# Patient Record
Sex: Female | Born: 2000 | Race: Black or African American | Hispanic: No | Marital: Single | Smoking: Never smoker
Health system: Southern US, Community
[De-identification: ages and names within clinical notes are randomized; demographics above are authoritative.]

---
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
## 2018-09-28 ENCOUNTER — Ambulatory Visit (HOSPITAL_COMMUNITY)
Admission: EM | Admit: 2018-09-28 | Discharge: 2018-09-28 | Disposition: A | Discharge status: Home / Self Care | Payer: 59 | Attending: Family Medicine | Admitting: Family Medicine

## 2018-09-28 ENCOUNTER — Encounter (HOSPITAL_COMMUNITY): Payer: Self-pay | Admitting: Emergency Medicine

## 2018-09-28 DIAGNOSIS — M778 Other enthesopathies, not elsewhere classified: Secondary | ICD-10-CM | POA: Diagnosis not present

## 2018-09-28 MED ORDER — IBUPROFEN 600 MG PO TABS: 600.0000 mg | ORAL_TABLET | Freq: Four times a day (QID) | ORAL | 0 refills | Status: DC | PRN

## 2018-09-28 NOTE — ED Triage Notes (Signed)
Pt sts right arm pain x 2 days but denies injury

## 2018-09-28 NOTE — Discharge Instructions (Signed)
Use anti-inflammatories for pain/swelling. You may take up to 600-800 mg Ibuprofen every 8 hours with food. You may supplement Ibuprofen with Tylenol [phone] mg every 8 hours.   Ice elbow Rest elbow  Wear wrist splint to avoid motions that aggravate the pain at the elbow.   Follow up if pain not improving or worsening, unable to move elbow

## 2018-09-29 NOTE — ED Provider Notes (Addendum)
[facility]    CSN: [id number] Arrival date & time: 09/28/18  1519     History   Chief Complaint Chief Complaint  Patient presents with  . Arm Pain    HPI Sandy Rollins is a 17 y.o. female no significant past medical history presenting today for evaluation of right elbow pain.  Patient states that for the past 2 days she has had worsening pain especially with movement of her elbow.  He is having difficulty doing things above her head due to the pain.  She denies any injury.  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  Patient is right-handed.  Denies previous issues with her elbow.  She has tried Tylenol extra strength as well as IcyHot.  Denies pain at the shoulder, wrist/hand.  Denies numbness or tingling.  HPI  History reviewed. No pertinent past medical history.  There are no active problems to display for this patient.   History reviewed. No pertinent surgical history.  OB History   None      Home Medications    Prior to Admission medications   Medication Sig Start Date End Date Taking? Authorizing Provider  ibuprofen (ADVIL,MOTRIN) 600 MG tablet Take 1 tablet (600 mg total) by mouth every 6 (six) hours as needed. 09/28/18   Wieters, Junius Creamer, PA-C    Family History Family History  Family history unknown: Yes    Social History Social History   Tobacco Use  . Smoking status: Never Smoker  . Smokeless tobacco: Never Used  Substance Use Topics  . Alcohol use: No  . Drug use: No     Allergies   Patient has no known allergies.   Review of Systems Review of Systems  Constitutional: Negative for fatigue and fever.  Eyes: Negative for visual disturbance.  Respiratory: Negative for shortness of breath.   Cardiovascular: Negative for chest pain.  Gastrointestinal: Negative for abdominal pain, nausea and vomiting.  Musculoskeletal: Positive for arthralgias, joint swelling and myalgias.  Skin:  Positive for color change and rash. Negative for wound.  Neurological: Negative for dizziness, weakness, light-headedness and headaches.     Physical Exam Triage Vital Signs ED Triage Vitals [09/28/18 1610]  Enc Vitals Group     BP 113/63     Pulse Rate 66     Resp 18     Temp 97.8 F (36.6 C)     Temp Source Oral     SpO2 100 %     Weight      Height      Head Circumference      Peak Flow      Pain Score 8     Pain Loc      Pain Edu?      Excl. in GC?    No data found.  Updated Vital Signs BP 113/63 (BP Location: Left Arm)   Pulse 66   Temp 97.8 F (36.6 C) (Oral)   Resp 18   SpO2 100%   Visual Acuity Right Eye Distance:   Left Eye Distance:   Bilateral Distance:    Right Eye Near:   Left Eye Near:    Bilateral Near:     Physical Exam  Constitutional: She is oriented to person, place, and time. She appears well-developed and well-nourished.  No acute distress  HENT:  Head: Normocephalic and atraumatic.  Nose: Nose normal.  Eyes: Conjunctivae are normal.  Neck: Neck supple.  Cardiovascular:  Normal rate.  Pulmonary/Chest: Effort normal. No respiratory distress.  Abdominal: She exhibits no distension.  Musculoskeletal: Normal range of motion.  Patient holding right arm flexed at 90 degrees Tender to palpation overlying medial and lateral areas of elbow, tender to palpation throughout the forearm and distal ulna and radius.  Full active range of motion of fingers and wrist.  Patient is able to extend and flex arm although patient resisting significantly due to pain.  Full active range of motion at shoulder.  Mild swelling and minimal increase in warmth, no overlying erythema to elbow  Neurological: She is alert and oriented to person, place, and time.  Skin: Skin is warm and dry.  Psychiatric: She has a normal mood and affect.  Nursing note and vitals reviewed.    UC Treatments / Results  Labs (all labs ordered are listed, but only abnormal results  are displayed) Labs Reviewed - No data to display  EKG None  Radiology No results found.  Procedures Procedures (including critical care time)  Medications Ordered in UC Medications - No data to display  Initial Impression / Assessment and Plan / UC Course  I have reviewed the triage vital signs and the nursing notes.  Pertinent labs & imaging results that were available during my care of the patient were reviewed by me and considered in my medical decision making (see chart for details).     Patient likely with overuse injury at elbow, tendinitis versus elbow/golfer's elbow.  Patient very resistant to move elbow, but does not have significant erythema, swelling concerning for septic joint.  At this time will treat for tendinitis, given lack of injury will defer imaging.  Will provide a long forearm wrist splint to limit the motions that would aggravate tendinitis, ice, rest and anti-inflammatories consistently.Discussed strict return precautions. Patient verbalized understanding and is agreeable with plan.  Final Clinical Impressions(s) / UC Diagnoses   Final diagnoses:  Elbow tendonitis     Discharge Instructions     Use anti-inflammatories for pain/swelling. You may take up to 600-800 mg Ibuprofen every 8 hours with food. You may supplement Ibuprofen with Tylenol [phone] mg every 8 hours.   Ice elbow Rest elbow  Wear wrist splint to avoid motions that aggravate the pain at the elbow.   Follow up if pain not improving or worsening, unable to move elbow   ED Prescriptions    Medication Sig Dispense Auth. Provider   ibuprofen (ADVIL,MOTRIN) 600 MG tablet Take 1 tablet (600 mg total) by mouth every 6 (six) hours as needed. 30 tablet Wieters, [place] C, PA-C     Controlled Substance Prescriptions [place] Controlled Substance Registry consulted? Not Applicable   Lew Dawes, PA-C 09/29/18 1914    Lew Dawes, PA-C 09/29/18 [phone]

## 2018-12-24 ENCOUNTER — Encounter (HOSPITAL_COMMUNITY): Payer: Self-pay | Admitting: Emergency Medicine

## 2018-12-24 ENCOUNTER — Ambulatory Visit (HOSPITAL_COMMUNITY)
Admission: EM | Admit: 2018-12-24 | Discharge: 2018-12-24 | Disposition: A | Discharge status: Home / Self Care | Payer: 59 | Attending: Family Medicine | Admitting: Family Medicine

## 2018-12-24 DIAGNOSIS — R69 Illness, unspecified: Secondary | ICD-10-CM | POA: Diagnosis present

## 2018-12-24 DIAGNOSIS — J111 Influenza due to unidentified influenza virus with other respiratory manifestations: Secondary | ICD-10-CM

## 2018-12-24 LAB — POCT RAPID STREP A: Streptococcus, Group A Screen (Direct): NEGATIVE

## 2018-12-24 MED ORDER — CETIRIZINE HCL 10 MG PO CAPS: 10.0000 mg | ORAL_CAPSULE | Freq: Every day | ORAL | 0 refills | Status: DC

## 2018-12-24 MED ORDER — IBUPROFEN 600 MG PO TABS: 600.0000 mg | ORAL_TABLET | Freq: Four times a day (QID) | ORAL | 0 refills | Status: DC | PRN

## 2018-12-24 NOTE — Discharge Instructions (Signed)
Your rapid strep tested Negative today. We will send for a culture and call in about 2 days if results are positive. For now we will treat your sore throat as a virus with symptom management. Symptoms are concerning for flu as well, this is also a virus the runs its course over 1 week and requires treatment of symptoms.  Use anti-inflammatories for pain/swelling. You may take up to 800 mg Ibuprofen every 8 hours with food. You may supplement Ibuprofen with Tylenol [phone] mg every 8 hours.    Please continue Tylenol or Ibuprofen for fever and pain. May try salt water gargles, cepacol lozenges, throat spray, or OTC cold relief medicine for throat discomfort. If you also have congestion take a daily anti-histamine like Zyrtec, Claritin, and a oral decongestant to help with post nasal drip that may be irritating your throat.   Stay hydrated and drink plenty of fluids to keep your throat coated relieve irritation.

## 2018-12-24 NOTE — ED Provider Notes (Signed)
[facility]    CSN: [id number] Arrival date & time: 12/24/18  [id number]     History   Chief Complaint Chief Complaint  Patient presents with  . Fever  . Generalized Body Aches    HPI Sandy Rollins is a 18 y.o. female no significant past medical history presenting today for evaluation of fever and body aches.  Patient states that last night she began to have soreness in her chest.  When she woke up this morning she felt okay, but when she went to work her symptoms worsen.  She noted to have a fever at work of 101.  She states that she did not take anything for her temperature.  She is also had associated headache, pressure on the eyes as well as a sore throat.  Denies vision changes, denies eye drainage.  She denies any nasal congestion or cough.  Denies shortness of breath.  Denies nausea, vomiting or diarrhea.  Patient also has had decreased appetite and states that she has lost her taste.  HPI  History reviewed. No pertinent past medical history.  There are no active problems to display for this patient.   History reviewed. No pertinent surgical history.  OB History   No obstetric history on file.      Home Medications    Prior to Admission medications   Medication Sig Start Date End Date Taking? Authorizing Provider  Cetirizine HCl 10 MG CAPS Take 1 capsule (10 mg total) by mouth daily for 10 days. 12/24/18 01/03/19  Keylen Eckenrode C, PA-C  ibuprofen (ADVIL,MOTRIN) 600 MG tablet Take 1 tablet (600 mg total) by mouth every 6 (six) hours as needed. 12/24/18   Aasia Peavler, Junius CreamerHallie C, PA-C    Family History Family History  Family history unknown: Yes    Social History Social History   Tobacco Use  . Smoking status: Never Smoker  . Smokeless tobacco: Never Used  Substance Use Topics  . Alcohol use: No  . Drug use: No     Allergies   Patient has no known allergies.   Review of Systems Review of Systems  Constitutional: Positive for appetite change,  chills, fatigue and fever. Negative for activity change.  HENT: Positive for sore throat. Negative for congestion, ear pain, rhinorrhea, sinus pressure and trouble swallowing.   Eyes: Negative for discharge, redness and visual disturbance.  Respiratory: Negative for cough, chest tightness and shortness of breath.   Cardiovascular: Negative for chest pain.  Gastrointestinal: Negative for abdominal pain, diarrhea, nausea and vomiting.  Musculoskeletal: Positive for myalgias.  Skin: Negative for rash.  Neurological: Positive for headaches. Negative for dizziness and light-headedness.     Physical Exam Triage Vital Signs ED Triage Vitals  Enc Vitals Group     BP 12/24/18 1734 116/68     Pulse Rate 12/24/18 1734 (!) 112     Resp 12/24/18 1734 17     Temp 12/24/18 1734 98.3 F (36.8 C)     Temp Source 12/24/18 1734 Temporal     SpO2 12/24/18 1734 100 %     Weight 12/24/18 1735 115 lb (52.2 kg)     Height 12/24/18 1735 5\' 3"  (1.6 m)     Head Circumference --      Peak Flow --      Pain Score 12/24/18 1735 9     Pain Loc --      Pain Edu? --      Excl. in GC? --    No data  found.  Updated Vital Signs BP 116/68 (BP Location: Left Arm)   Pulse (!) 112   Temp 98.3 F (36.8 C) (Temporal)   Resp 17   Ht 5\' 3"  (1.6 m)   Wt 115 lb (52.2 kg)   LMP 11/25/2018   SpO2 100%   BMI 20.37 kg/m   Visual Acuity Right Eye Distance:   Left Eye Distance:   Bilateral Distance:    Right Eye Near:   Left Eye Near:    Bilateral Near:     Physical Exam Vitals signs and nursing note reviewed.  Constitutional:      General: She is not in acute distress.    Appearance: She is well-developed.     Comments: Patient lying on exam table, appears uncomfortable and tired, but nontoxic appearing, no acute distress  HENT:     Head: Normocephalic and atraumatic.     Ears:     Comments: Bilateral ears without tenderness to palpation of external auricle, tragus and mastoid, EAC's without erythema  or swelling, TM's with good bony landmarks and cone of light. Non erythematous.    Nose:     Comments: Nasal mucosa erythematous, swollen turbinates bilaterally with rhinorrhea present    Mouth/Throat:     Comments: Oral mucosa pink and moist, no tonsillar enlargement or exudate, but the tonsils do appear slightly erythematous . Posterior pharynx patent and erythematous, no uvula deviation or swelling. Normal phonation. Eyes:     Conjunctiva/sclera: Conjunctivae normal.  Neck:     Musculoskeletal: Neck supple.  Cardiovascular:     Rate and Rhythm: Regular rhythm. Tachycardia present.     Heart sounds: No murmur.     Comments: Heart rate rechecked, 110 Pulmonary:     Effort: Pulmonary effort is normal. No respiratory distress.     Breath sounds: Normal breath sounds.     Comments: Breathing comfortably at rest, CTABL, no wheezing, rales or other adventitious sounds auscultated Abdominal:     Palpations: Abdomen is soft.     Tenderness: There is no abdominal tenderness.  Skin:    General: Skin is warm and dry.  Neurological:     Mental Status: She is alert.      UC Treatments / Results  Labs (all labs ordered are listed, but only abnormal results are displayed) Labs Reviewed  CULTURE, GROUP A STREP [facility])  POCT RAPID STREP A    EKG None  Radiology No results found.  Procedures Procedures (including critical care time)  Medications Ordered in UC Medications - No data to display  Initial Impression / Assessment and Plan / UC Course  I have reviewed the triage vital signs and the nursing notes.  Pertinent labs & imaging results that were available during my care of the patient were reviewed by me and considered in my medical decision making (see chart for details).    18 year old female, no significant past medical history, 1 day of subjective fever, body aches and other non-focal symptoms.  Exam nonfocal.  Patient is tachycardic however.  Strep test negative.  Will  treat for likely viral illness, possible influenza.  Will recommend symptomatic and supportive care.  Close monitoring.  Discussed symptomatic management of sore throat, recommendations below.  Discussed importance of oral hydration despite decreased appetite.Discussed strict return precautions. Patient verbalized understanding and is agreeable with plan.  Final Clinical Impressions(s) / UC Diagnoses   Final diagnoses:  Influenza-like illness     Discharge Instructions     Your rapid strep tested Negative  today. We will send for a culture and call in about 2 days if results are positive. For now we will treat your sore throat as a virus with symptom management. Symptoms are concerning for flu as well, this is also a virus the runs its course over 1 week and requires treatment of symptoms.  Use anti-inflammatories for pain/swelling. You may take up to 800 mg Ibuprofen every 8 hours with food. You may supplement Ibuprofen with Tylenol [phone] mg every 8 hours.    Please continue Tylenol or Ibuprofen for fever and pain. May try salt water gargles, cepacol lozenges, throat spray, or OTC cold relief medicine for throat discomfort. If you also have congestion take a daily anti-histamine like Zyrtec, Claritin, and a oral decongestant to help with post nasal drip that may be irritating your throat.   Stay hydrated and drink plenty of fluids to keep your throat coated relieve irritation.    ED Prescriptions    Medication Sig Dispense Auth. Provider   ibuprofen (ADVIL,MOTRIN) 600 MG tablet Take 1 tablet (600 mg total) by mouth every 6 (six) hours as needed. 30 tablet Kharter Brew C, PA-C   Cetirizine HCl 10 MG CAPS Take 1 capsule (10 mg total) by mouth daily for 10 days. 10 capsule Abdulrahim Siddiqi C, PA-C     Controlled Substance Prescriptions [place] Controlled Substance Registry consulted? Not Applicable   Lew DawesWieters, Evanthia Maund C, [place]PA-C 12/24/18 1908

## 2018-12-24 NOTE — ED Triage Notes (Signed)
Pt. Stated, I got a fever and my body is sore.

## 2018-12-27 LAB — CULTURE, GROUP A STREP (THRC)

## 2019-06-28 ENCOUNTER — Emergency Department (HOSPITAL_COMMUNITY)
Admission: EM | Admit: 2019-06-28 | Discharge: 2019-06-28 | Disposition: A | Discharge status: Home / Self Care | Payer: 59 | Attending: Emergency Medicine | Admitting: Emergency Medicine

## 2019-06-28 ENCOUNTER — Other Ambulatory Visit: Payer: Self-pay

## 2019-06-28 DIAGNOSIS — M545 Low back pain, unspecified: Secondary | ICD-10-CM

## 2019-06-28 DIAGNOSIS — N3000 Acute cystitis without hematuria: Secondary | ICD-10-CM | POA: Diagnosis not present

## 2019-06-28 LAB — URINALYSIS, ROUTINE W REFLEX MICROSCOPIC
Bilirubin Urine: NEGATIVE
Glucose, UA: NEGATIVE mg/dL
Hgb urine dipstick: NEGATIVE
Ketones, ur: NEGATIVE mg/dL
Nitrite: NEGATIVE
Protein, ur: NEGATIVE mg/dL
Specific Gravity, Urine: 1.023 (ref 1.005–1.030)
pH: 6 (ref 5.0–8.0)

## 2019-06-28 LAB — POC URINE PREG, ED: Preg Test, Ur: NEGATIVE

## 2019-06-28 MED ORDER — ACETAMINOPHEN 325 MG PO TABS
650.0000 mg | ORAL_TABLET | Freq: Once | ORAL | Status: AC
  Administered 2019-06-28: 650 mg via ORAL
  Filled 2019-06-28: qty 2

## 2019-06-28 MED ORDER — SULFAMETHOXAZOLE-TRIMETHOPRIM 800-160 MG PO TABS: 1.0000 | ORAL_TABLET | Freq: Two times a day (BID) | ORAL | 0 refills | Status: AC

## 2019-06-28 NOTE — Discharge Instructions (Addendum)
You have been seen today for back pain. Please read and follow all provided instructions.   1. Medications: tylenol/ibuprofen for back pain, Bactrim (antibiotic), usual home medications 2. Treatment: rest, drink plenty of fluids 3. Follow Up: Please follow up with your primary doctor in 2-5 days for discussion of your diagnoses and further evaluation after today's visit; if you do not have a primary care doctor use the resource guide provided to find one; Please return to the ER for any new or worsening symptoms. Please obtain all of your results from medical records or have your doctors office obtain the results - share them with your doctor - you should be seen at your doctors office. Call today to arrange your follow up.   Take medications as prescribed. Please review all of the medicines and only take them if you do not have an allergy to them. Return to the emergency room for worsening condition or new concerning symptoms. Follow up with your regular doctor. If you don't have a regular doctor use one of the numbers below to establish a primary care doctor.  Please be aware that if you are taking birth control pills, taking other prescriptions, ESPECIALLY ANTIBIOTICS may make the birth control ineffective - if this is the case, either do not engage in sexual activity or use alternative methods of birth control such as condoms until you have finished the medicine and your family doctor says it is OK to restart them. If you are on a blood thinner such as COUMADIN, be aware that any other medicine that you take may cause the coumadin to either work too much, or not enough - you should have your coumadin level rechecked in next 7 days if this is the case.  ?  It is also a possibility that you have an allergic reaction to any of the medicines that you have been prescribed - Everybody reacts differently to medications and while MOST people have no trouble with most medicines, you may have a reaction such as  nausea, vomiting, rash, swelling, shortness of breath. If this is the case, please stop taking the medicine immediately and contact your physician.  ?  You should return to the ER if you develop severe or worsening symptoms.   Emergency Department Resource Guide 1) Find a Doctor and Pay Out of Pocket Although you won't have to find out who is covered by your insurance plan, it is a good idea to ask around and get recommendations. You will then need to call the office and see if the doctor you have chosen will accept you as a new patient and what types of options they offer for patients who are self-pay. Some doctors offer discounts or will set up payment plans for their patients who do not have insurance, but you will need to ask so you aren't surprised when you get to your appointment.  2) Contact Your Local Health Department Not all health departments have doctors that can see patients for sick visits, but many do, so it is worth a call to see if yours does. If you don't know where your local health department is, you can check in your phone book. The CDC also has a tool to help you locate your state's health department, and many state websites also have listings of all of their local health departments.  3) Find a [facility] If your illness is not likely to be very severe or complicated, you may want to try a walk in clinic.  These are popping up all over the country in pharmacies, drugstores, and shopping centers. They're usually staffed by nurse practitioners or physician assistants that have been trained to treat common illnesses and complaints. They're usually fairly quick and inexpensive. However, if you have serious medical issues or chronic medical problems, these are probably not your best option.  No Primary Care Doctor: Call [facility] Connect at  [phone] - they can help you locate a primary care doctor that  accepts your insurance, provides certain services, etc. Physician Referral  Service- [phone]  Chronic Pain Problems: Organization         Address  Phone   Notes  [facility]  [phone] Patients need to be referred by their primary care doctor.   Medication Assistance: Organization         Address  Phone   Notes  [facility] Medication [facility] [address] [place], [place] [address] [phone] --Must be a resident of [facility] -- Must have NO insurance coverage whatsoever (no Medicaid/ Medicare, etc.) -- The pt. MUST have a primary care doctor that directs their care regularly and follows them in the community   [facility]  [phone]   [employer]  [phone]    Agencies that provide inexpensive medical care: Organization         Address  Phone   Notes  [place]  [phone]   [facility]    [phone]   [facility] [place],  [address] [phone]   [place] [address] [place] [phone]   [facility]    [phone]   [facility]    [phone]   [place] and [address], [place] Phone:  [phone], Fax:  [phone] Hours of Operation:  9 am - 6 pm, M-F.  Also accepts Medicaid/Medicare and self-pay.  [facility] [address], [address], [place] Phone: [phone], Fax: [phone]. Hours of Operation:  8:30 am - 5:30 pm, M-F.  Also accepts Medicaid and self-pay.  [facility] [address] [place] Phone: [phone]   [place], [place], [place] [phone], [phone] Mondays & Thursdays: 7-9 AM.  First 15 patients are seen on a first come, first serve basis.    [place] Providers:  Organization         Address  Phone   Notes  [facility] [address],  [phone] Also accepts self-pay patients.  [facility] [address], [place]  [phone]   [place], [address], [place] [phone]   [facility] [address], [place] [phone]   Lucianne Lei [address], [place]   [phone] Only accepts [place] [facility] [place] patients after they have their name applied to their card.   Self-Pay (no insurance) in [facility]:  Organization         Address  Phone   Notes  Sickle Cell Patients, [facility] [place] [phone]   [facility] [place] [phone]   [facility] [address]  [place], [address], [place] [phone]   [facility]/Dr. Osei-Bonsu  [address] [place] or [address], [place] [phone] Phone number for both [place] and [place] locations is the same.  Urgent Medical and [facility] [address] [place] [phone]   [facility] [address] [place] or [address] [phone]   [facility] [address], [place] [phone], phone; [phone], fax Sees patients 1st and 3rd Saturday of every month.  Must not qualify for public or private insurance (i.e. Medicaid, Medicare, [place] Health Choice, Veterans' Benefits)  Household income should be no more than 200% of the poverty level The clinic cannot treat you if you are pregnant or think you are pregnant  Sexually transmitted diseases are not treated at the clinic.

## 2019-06-28 NOTE — ED Notes (Signed)
Pt states she's going to her car and will be back

## 2019-06-28 NOTE — ED Notes (Signed)
Patient verbalizes understanding of discharge instructions. Opportunity for questioning and answering were provided.  patient discharged from ED.  

## 2019-06-28 NOTE — ED Triage Notes (Signed)
Pt reports  Back pain after removing a "birth control patch that was causing me headaches " pt denies any further headaches at this time and denies any trauma to the area ; denies any urinary symptoms

## 2019-06-28 NOTE — ED Provider Notes (Signed)
[facility] EMERGENCY DEPARTMENT Provider Note   CSN: [id number] Arrival date & time: 06/28/19  0901    History   Chief Complaint Chief Complaint  Patient presents with  . Back Pain    HPI Sandy Rollins is a 18 y.o. female with no significant past medical history presenting with atraumatic intermittent left sided non radiating left back pain onset 2 days ago. Patient reports nothing makes symptoms better or worse. Patient denies trying any treatments. Patient describes pain as a soreness. Patient states she removed a birth control patch from her left lower back around the time symptoms began. Patient states she was having headaches from her birth control patch and took it off. Patient states headaches have completely resolved. Patient reports intermittent urinary frequency. Patient denies dysuria, vaginal discharge, or hematuria. Denies numbness, tingling, weakness, incontinence to bowel/bladder, fever, chills, IV drug use, or hx of cancer.      HPI  No past medical history on file.  There are no active problems to display for this patient.   No past surgical history on file.   OB History   No obstetric history on file.      Home Medications    Prior to Admission medications   Medication Sig Start Date End Date Taking? Authorizing Provider  sulfamethoxazole-trimethoprim (BACTRIM DS) 800-160 MG tablet Take 1 tablet by mouth 2 (two) times daily for 7 days. 06/28/19 07/05/19  Arville Lime, PA-C    Family History Family History  Family history unknown: Yes    Social History Social History   Tobacco Use  . Smoking status: Never Smoker  . Smokeless tobacco: Never Used  Substance Use Topics  . Alcohol use: No  . Drug use: No     Allergies   Patient has no known allergies.   Review of Systems Review of Systems  Constitutional: Negative for activity change, chills, diaphoresis, fever and unexpected weight change.  Respiratory: Negative  for cough and shortness of breath.   Cardiovascular: Negative for chest pain, palpitations and leg swelling.  Gastrointestinal: Negative for abdominal pain, constipation, diarrhea, nausea and vomiting.  Genitourinary: Positive for frequency. Negative for difficulty urinating, dysuria, flank pain and hematuria.  Musculoskeletal: Positive for back pain. Negative for arthralgias, gait problem, joint swelling, myalgias, neck pain and neck stiffness.  Skin: Negative for rash.  Allergic/Immunologic: Negative for immunocompromised state.  Neurological: Negative for dizziness, syncope, weakness and numbness.  Hematological: Does not bruise/bleed easily.     Physical Exam Updated Vital Signs BP 106/77 (BP Location: Right Arm)   Pulse 77   Temp 97.8 F (36.6 C) (Oral)   Resp 14   SpO2 98%   Physical Exam Physical Exam  Constitutional: Pt appears well-developed and well-nourished. No distress.  HENT:  Head: Normocephalic and atraumatic.  Mouth/Throat: Oropharynx is clear and moist. No oropharyngeal exudate.  Eyes: Conjunctivae are normal.  Neck: Normal range of motion. Neck supple. Full ROM without pain. No cervical midline or paraspinal tenderness noted. Cardiovascular: Normal rate, regular rhythm and intact distal pulses.   Pulmonary/Chest: Effort normal and breath sounds normal. No respiratory distress. Pt has no wheezes.  Abdominal: Soft. Pt exhibits no distension. There is no tenderness.  Musculoskeletal:  Full range of motion of the T-spine, and L-spine. No tenderness to palpation of the spinous processes of the T-spine or L-spine. Mild tenderness to palpation of the left paraspinous muscles of the L-spine. Negative straight leg test.  Neurological: Pt is alert. Speech is clear and  goal oriented, follows commands. Pt has normal reflexes. Reflex Scores:      Patellar reflexes are 2+ on the right side and 2+ on the left side.      Achilles reflexes are 2+ on the right side and 2+ on the  left side. Normal 5/5 strength in upper and lower extremities bilaterally including dorsiflexion and plantar flexion, strong and equal grip strength. Sensation is normal to light touch. Moves extremities without ataxia, coordination intact. Normal gait and balance. Skin: Skin is warm and dry. No rash, erythema, edema, or skin changes noted. Pt is not diaphoretic.  Psychiatric: Pt has a normal mood and affect. Behavior is normal.  Nursing note and vitals reviewed.   ED Treatments / Results  Labs (all labs ordered are listed, but only abnormal results are displayed) Labs Reviewed  URINALYSIS, ROUTINE W REFLEX MICROSCOPIC - Abnormal; Notable for the following components:      Result Value   APPearance HAZY (*)    Leukocytes,Ua MODERATE (*)    Bacteria, UA RARE (*)    All other components within normal limits  URINE CULTURE  POC URINE PREG, ED    EKG None  Radiology No results found.  Procedures Procedures (including critical care time)  Medications Ordered in ED Medications  acetaminophen (TYLENOL) tablet 650 mg (650 mg Oral Given 06/28/19 1010)     Initial Impression / Assessment and Plan / ED Course  I have reviewed the triage vital signs and the nursing notes.  Pertinent labs & imaging results that were available during my care of the patient were reviewed by me and considered in my medical decision making (see chart for details).  Clinical Course as of Jun 27 1142  Mon Jun 28, 2019  1131 UA reveals moderate leukocytosis, rare bacteria, and WBCs. Patient reports urinary frequency. Ordered urine culture.  Urinalysis, Routine w reflex microscopic(!) [AH]    Clinical Course User Index [AH] Leretha DykesHernandez, Yohanna Tow P, PA-C      Patient with back pain.  No neurological deficits and normal neuro exam.  Patient can walk but states is painful.  No loss of bowel or bladder control.  No concern for cauda equina.  No fever, night sweats, weight loss, h/o cancer, IVDU. UA ordered due to  urinary frequency. UA reveals moderate leukocytes, rare bacteria, and WBCs. Ordered urine culture. Will prescribe antibiotics. Patient is afebrile and in no acute distress. Discussed return precautions. Patient states she understands and agrees with plan.   Final Clinical Impressions(s) / ED Diagnoses   Final diagnoses:  Acute left-sided low back pain without sciatica  Acute cystitis without hematuria    ED Discharge Orders         Ordered    sulfamethoxazole-trimethoprim (BACTRIM DS) 800-160 MG tablet  2 times daily     06/28/19 [address]1142           Shira Bobst York HavenP, [place]-C 06/28/19 1143    Jacalyn LefevreHaviland, Julie, MD 06/28/19 1237

## 2019-06-29 LAB — URINE CULTURE

## 2020-12-19 ENCOUNTER — Ambulatory Visit (HOSPITAL_COMMUNITY)
Admission: EM | Admit: 2020-12-19 | Discharge: 2020-12-19 | Discharge status: Against Medical Advice | Payer: No Typology Code available for payment source

## 2020-12-19 NOTE — ED Notes (Signed)
Patient was called several times with no response.  

## 2021-01-11 ENCOUNTER — Other Ambulatory Visit: Payer: Self-pay

## 2021-01-11 ENCOUNTER — Inpatient Hospital Stay (HOSPITAL_COMMUNITY)
Admission: AD | Admit: 2021-01-11 | Discharge: 2021-01-11 | Disposition: A | Discharge status: Home / Self Care | Payer: No Typology Code available for payment source | Attending: Obstetrics & Gynecology | Admitting: Obstetrics & Gynecology

## 2021-01-11 DIAGNOSIS — N898 Other specified noninflammatory disorders of vagina: Secondary | ICD-10-CM | POA: Diagnosis not present

## 2021-01-11 DIAGNOSIS — Z3202 Encounter for pregnancy test, result negative: Secondary | ICD-10-CM

## 2021-01-11 LAB — POCT PREGNANCY, URINE: Preg Test, Ur: NEGATIVE

## 2021-01-11 NOTE — Discharge Instructions (Signed)

## 2021-01-11 NOTE — MAU Provider Note (Signed)
Event Date/Time   First Provider Initiated Contact with Patient 01/11/21 1646      S Ms. Sandy Rollins is a 20 y.o. patient who presents to MAU today with complaint of abnormal vaginal discharge. This is a new problem, onset three days ago. Patient has not taken a home pregnancy test.   O BP 112/66   Pulse 82   Temp 98.2 F (36.8 C)   Resp 18   Ht 5\' 1"  (1.549 m)   Wt 49.4 kg   LMP 12/27/2020   BMI 20.60 kg/m    Physical Exam Vitals and nursing note reviewed. Exam conducted with a chaperone present.  Constitutional:      Appearance: Normal appearance.  Cardiovascular:     Rate and Rhythm: Normal rate.     Pulses: Normal pulses.  Pulmonary:     Effort: Pulmonary effort is normal.  Skin:    Capillary Refill: Capillary refill takes less than 2 seconds.  Neurological:     Mental Status: She is alert.  Psychiatric:        Mood and Affect: Mood normal.        Behavior: Behavior normal.        Thought Content: Thought content normal.        Judgment: Judgment normal.     A Medical screening exam complete Negative urine pregnancy test in MAU  P Discharge from MAU in stable condition Patient given the option of transfer to [facility] for further evaluation or seek care in outpatient facility of choice  List of options for follow-up given  Warning signs for worsening condition that would warrant emergency follow-up discussed Patient may return to MAU as needed   F/U: --Patient verbalizes preference to follow-up with her GYN in [facility]  [facility], Calvert Cantor 01/11/2021 4:52 PM

## 2021-01-11 NOTE — MAU Note (Signed)
3 days ago Pt reports she has had some clear discharge with some blood streaks. Now discharge is brown. LMP 12/27/2020. Had some cramping yesterday but no pain today.

## 2021-05-26 ENCOUNTER — Ambulatory Visit (HOSPITAL_COMMUNITY)
Admission: EM | Admit: 2021-05-26 | Discharge: 2021-05-26 | Disposition: A | Discharge status: Home / Self Care | Payer: No Typology Code available for payment source | Attending: Medical Oncology | Admitting: Medical Oncology

## 2021-05-26 ENCOUNTER — Encounter (HOSPITAL_COMMUNITY): Payer: Self-pay

## 2021-05-26 DIAGNOSIS — Z113 Encounter for screening for infections with a predominantly sexual mode of transmission: Secondary | ICD-10-CM | POA: Insufficient documentation

## 2021-05-26 DIAGNOSIS — N898 Other specified noninflammatory disorders of vagina: Secondary | ICD-10-CM | POA: Diagnosis present

## 2021-05-26 LAB — POC URINE PREG, ED: Preg Test, Ur: NEGATIVE

## 2021-05-26 NOTE — ED Provider Notes (Signed)
[facility]    CSN: [id number] Arrival date & time: 05/26/21  1446      History   Chief Complaint Chief Complaint  Patient presents with   Vaginal Discharge    HPI Sandy Rollins is a 20 y.o. female.   HPI  Vaginal Discharge:  Patient states that today she noticed some vaginal discharge and odor.  Describes the vaginal discharge as white to green in nature and sticky.  She is unable to describe the odor.  She denies any abdominal pain, pelvic pain, dysuria, fevers, vomiting or risk of retained tampon.  She has not tried anything for symptoms.  No known exposures but wishes to have STI screening.  She is unsure when her last menstrual cycle was.  History reviewed. No pertinent past medical history.  Patient Active Problem List   Diagnosis Date Noted   Negative pregnancy test 01/11/2021    History reviewed. No pertinent surgical history.  OB History   No obstetric history on file.      Home Medications    Prior to Admission medications   Not on File    Family History Family History  Family history unknown: Yes    Social History Social History   Tobacco Use   Smoking status: Never   Smokeless tobacco: Never  Substance Use Topics   Alcohol use: No   Drug use: No     Allergies   Patient has no known allergies.   Review of Systems Review of Systems  As stated above in HPI Physical Exam Triage Vital Signs ED Triage Vitals  Enc Vitals Group     BP 05/26/21 1501 (!) 109/47     Pulse Rate 05/26/21 1501 78     Resp 05/26/21 1501 18     Temp 05/26/21 1501 97.9 F (36.6 C)     Temp Source 05/26/21 1501 Oral     SpO2 05/26/21 1501 98 %     Weight --      Height --      Head Circumference --      Peak Flow --      Pain Score 05/26/21 1502 0     Pain Loc --      Pain Edu? --      Excl. in GC? --    No data found.  Updated Vital Signs BP (!) 109/47 (BP Location: Left Arm)   Pulse 78   Temp 97.9 F (36.6 C) (Oral)   Resp 18    SpO2 98%   Physical Exam Vitals and nursing note reviewed.  Constitutional:      General: She is not in acute distress.    Appearance: Normal appearance. She is not ill-appearing, toxic-appearing or diaphoretic.  HENT:     Head: Normocephalic and atraumatic.  Cardiovascular:     Rate and Rhythm: Normal rate and regular rhythm.     Heart sounds: Normal heart sounds.  Pulmonary:     Effort: Pulmonary effort is normal.     Breath sounds: Normal breath sounds.  Abdominal:     General: Bowel sounds are normal.     Palpations: Abdomen is soft. There is no mass.     Tenderness: There is no abdominal tenderness. There is no right CVA tenderness, left CVA tenderness, guarding or rebound.     Hernia: No hernia is present.  Genitourinary:    Comments: Pt provides self swab collection Neurological:     Mental Status: She is alert.  UC Treatments / Results  Labs (all labs ordered are listed, but only abnormal results are displayed) Labs Reviewed - No data to display  EKG   Radiology No results found.  Procedures Procedures (including critical care time)  Medications Ordered in UC Medications - No data to display  Initial Impression / Assessment and Plan / UC Course  I have reviewed the triage vital signs and the nursing notes.  Pertinent labs & imaging results that were available during my care of the patient were reviewed by me and considered in my medical decision making (see chart for details).     New.  Cytology pending. Urine pregnancy test is negative. Will treat according to results. Discussed red flag signs and symptoms.    Final Clinical Impressions(s) / UC Diagnoses   Final diagnoses:  None   Discharge Instructions   None    ED Prescriptions   None    PDMP not reviewed this encounter.   Rushie Chestnut, [place] 05/26/21 1533

## 2021-05-26 NOTE — ED Triage Notes (Signed)
Pt in with c/o vaginal discharge and vaginal odor that she noticed today  Requesting STD testing

## 2021-05-28 ENCOUNTER — Telehealth (HOSPITAL_COMMUNITY): Payer: Self-pay | Admitting: Emergency Medicine

## 2021-05-28 LAB — CERVICOVAGINAL ANCILLARY ONLY
Bacterial Vaginitis (gardnerella): POSITIVE — AB
Candida Glabrata: NEGATIVE
Candida Vaginitis: POSITIVE — AB
Chlamydia: NEGATIVE
Comment: NEGATIVE
Comment: NEGATIVE
Comment: NEGATIVE
Comment: NEGATIVE
Comment: NEGATIVE
Comment: NORMAL
Neisseria Gonorrhea: NEGATIVE
Trichomonas: NEGATIVE

## 2021-05-28 MED ORDER — FLUCONAZOLE 150 MG PO TABS: 150.0000 mg | ORAL_TABLET | Freq: Once | ORAL | 0 refills | Status: AC

## 2021-05-28 MED ORDER — METRONIDAZOLE 500 MG PO TABS: 500.0000 mg | ORAL_TABLET | Freq: Two times a day (BID) | ORAL | 0 refills | Status: AC

## 2021-10-17 ENCOUNTER — Other Ambulatory Visit: Payer: Self-pay

## 2021-10-17 ENCOUNTER — Encounter (HOSPITAL_COMMUNITY): Payer: Self-pay | Admitting: *Deleted

## 2021-10-17 ENCOUNTER — Emergency Department (HOSPITAL_COMMUNITY)
Admission: EM | Admit: 2021-10-17 | Discharge: 2021-10-17 | Disposition: A | Discharge status: Home / Self Care | Payer: No Typology Code available for payment source | Attending: Emergency Medicine | Admitting: Emergency Medicine

## 2021-10-17 DIAGNOSIS — J101 Influenza due to other identified influenza virus with other respiratory manifestations: Secondary | ICD-10-CM | POA: Diagnosis not present

## 2021-10-17 DIAGNOSIS — R509 Fever, unspecified: Secondary | ICD-10-CM | POA: Diagnosis present

## 2021-10-17 DIAGNOSIS — Z20822 Contact with and (suspected) exposure to covid-19: Secondary | ICD-10-CM | POA: Diagnosis not present

## 2021-10-17 LAB — RESP PANEL BY RT-PCR (FLU A&B, COVID) ARPGX2
Influenza A by PCR: POSITIVE — AB
Influenza B by PCR: NEGATIVE
SARS Coronavirus 2 by RT PCR: NEGATIVE

## 2021-10-17 MED ORDER — ACETAMINOPHEN 325 MG PO TABS
650.0000 mg | ORAL_TABLET | Freq: Once | ORAL | Status: AC
  Administered 2021-10-17: 650 mg via ORAL
  Filled 2021-10-17: qty 2

## 2021-10-17 NOTE — ED Triage Notes (Signed)
Pt states headache, fevers, loss of taste, sore throat since yesterday.

## 2021-10-17 NOTE — ED Notes (Signed)
Pt verbalized understanding of d/c instructions, meds and followup care. Denies questions. VSS, no distress noted. Steady gait to exit with all belongings.  ?

## 2021-10-17 NOTE — Discharge Instructions (Signed)
Information about the fluids attached to these discharge papers.  Continue to treat your symptoms with over-the-counter medications.  I have written a work note for you to use for your job until Monday.  It was a pleasure to meet you and I hope they feel better.

## 2021-10-17 NOTE — ED Provider Notes (Signed)
[facility] EMERGENCY DEPARTMENT Provider Note   CSN: [id number] Arrival date & time: 10/17/21  1436     History Chief Complaint  Patient presents with   Fever    Sandy Rollins is a 20 y.o. female presenting today with complaint of URI symptoms.  She reports that yesterday she began to lose her sense of taste and developed headaches and a sore throat.  Today she began to feel weak and had nasal congestion.  Measured a temperature of 102 at home.  Has been utilizing TheraFlu which has not been helping her.  Works 2 jobs and is unable to work under these conditions.  Denies chest pain or difficulty breathing.   Fever Associated symptoms: chills, congestion, cough, headaches, myalgias and sore throat   Associated symptoms: no chest pain, no diarrhea, no nausea and no vomiting       History reviewed. No pertinent past medical history.  Patient Active Problem List   Diagnosis Date Noted   Negative pregnancy test 01/11/2021    History reviewed. No pertinent surgical history.   OB History   No obstetric history on file.     Family History  Family history unknown: Yes    Social History   Tobacco Use   Smoking status: Never   Smokeless tobacco: Never  Substance Use Topics   Alcohol use: No   Drug use: No    Home Medications Prior to Admission medications   Medication Sig Start Date End Date Taking? Authorizing Provider  metroNIDAZOLE (FLAGYL) 500 MG tablet Take 1 tablet (500 mg total) by mouth 2 (two) times daily. 05/28/21   Lamptey, Britta Mccreedy, MD    Allergies    Patient has no known allergies.  Review of Systems   Review of Systems  Constitutional:  Positive for chills and fever.  HENT:  Positive for congestion and sore throat.   Respiratory:  Positive for cough. Negative for shortness of breath.   Cardiovascular:  Negative for chest pain and palpitations.  Gastrointestinal:  Negative for diarrhea, nausea and vomiting.  Musculoskeletal:   Positive for myalgias.  Neurological:  Positive for headaches. Negative for dizziness.   Physical Exam Updated Vital Signs BP 110/69 (BP Location: Right Arm)   Pulse 63   Temp 97.7 F (36.5 C) (Oral)   Resp 18   Ht 5\' 1"  (1.549 m)   Wt 44.5 kg   LMP 09/30/2021   SpO2 98%   BMI 18.52 kg/m   Physical Exam Vitals and nursing note reviewed.  Constitutional:      Appearance: Normal appearance.  HENT:     Head: Normocephalic and atraumatic.     Right Ear: Tympanic membrane normal.     Left Ear: Tympanic membrane normal.     Nose: Congestion present.     Mouth/Throat:     Mouth: Mucous membranes are moist.     Pharynx: Oropharynx is clear. Posterior oropharyngeal erythema present.  Eyes:     General: No scleral icterus.    Conjunctiva/sclera: Conjunctivae normal.  Cardiovascular:     Rate and Rhythm: Normal rate and regular rhythm.  Pulmonary:     Effort: Pulmonary effort is normal. No respiratory distress.     Breath sounds: No wheezing or rales.  Skin:    General: Skin is warm and dry.     Findings: No rash.  Neurological:     Mental Status: She is alert.  Psychiatric:        Mood and Affect: Mood  normal.        Behavior: Behavior normal.    ED Results / Procedures / Treatments   Labs (all labs ordered are listed, but only abnormal results are displayed) Labs Reviewed  RESP PANEL BY RT-PCR (FLU A&B, COVID) ARPGX2 - Abnormal; Notable for the following components:      Result Value   Influenza A by PCR POSITIVE (*)    All other components within normal limits    EKG None  Radiology No results found.  Procedures Procedures   Medications Ordered in ED Medications  acetaminophen (TYLENOL) tablet 650 mg (650 mg Oral Given 10/17/21 1519)    ED Course  I have reviewed the triage vital signs and the nursing notes.  Pertinent labs & imaging results that were available during my care of the patient were reviewed by me and considered in my medical decision  making (see chart for details).    MDM Rules/Calculators/A&P Patient was evaluated by me.  She was in no acute distress and did not appear ill.  Breath sounds were clear and she did not complain of chest pain.  Her influenza test was positive.  She was informed of this and instructed to utilize over-the-counter medications.  We discussed side effects of Tamiflu and patient decided she did not want to take this.  Return precautions discussed, work note given and patient stable to ambulate out of the department at this time. Final Clinical Impression(s) / ED Diagnoses Final diagnoses:  Influenza A    Rx / DC Orders Results and diagnoses were explained to the patient. Return precautions discussed in full. Patient had no additional questions and expressed complete understanding.     Woodroe Chen 10/17/21 1832    Terrilee Files, MD 10/18/21 1020

## 2021-10-17 NOTE — ED Provider Notes (Signed)
Emergency Medicine Provider Triage Evaluation Note  Sandy Rollins , a 20 y.o. female  was evaluated in triage.  Pt complains of headache fever, body aches and fatigue.  Fevers over 101 at home.  Vaccinated against flu and covid. No known sick contacts.   Review of Systems  Positive: Headache, fever, body aches, fatigue Negative: Shortness of breath,   Physical Exam  BP 100/76 (BP Location: Right Arm)   Pulse 73   Temp 98 F (36.7 C)   Resp 18   SpO2 98%  Gen:   Awake, no distress   Resp:  Normal effort, normal phonation MSK:   Moves extremities without difficulty  Other:  Oropharynx clear and moist with out exudates or uvula deviation.   Medical Decision Making  Medically screening exam initiated at 3:07 PM.  Appropriate orders placed.  Sandy Rollins was informed that the remainder of the evaluation will be completed by another provider, this initial triage assessment does not replace that evaluation, and the importance of remaining in the ED until their evaluation is complete.  Note: Portions of this report may have been transcribed using voice recognition software. Every effort was made to ensure accuracy; however, inadvertent computerized transcription errors may be present    Cristina Gong, PA-C 10/17/21 1510    Pricilla Loveless, MD 10/17/21 1623

## 2022-01-14 ENCOUNTER — Emergency Department (HOSPITAL_BASED_OUTPATIENT_CLINIC_OR_DEPARTMENT_OTHER)
Admission: EM | Admit: 2022-01-14 | Discharge: 2022-01-14 | Disposition: A | Discharge status: Home / Self Care | Payer: No Typology Code available for payment source | Attending: Emergency Medicine | Admitting: Emergency Medicine

## 2022-01-14 ENCOUNTER — Other Ambulatory Visit: Payer: Self-pay

## 2022-01-14 ENCOUNTER — Encounter (HOSPITAL_BASED_OUTPATIENT_CLINIC_OR_DEPARTMENT_OTHER): Payer: Self-pay

## 2022-01-14 DIAGNOSIS — M542 Cervicalgia: Secondary | ICD-10-CM | POA: Diagnosis not present

## 2022-01-14 DIAGNOSIS — Y92410 Unspecified street and highway as the place of occurrence of the external cause: Secondary | ICD-10-CM | POA: Diagnosis not present

## 2022-01-14 DIAGNOSIS — R519 Headache, unspecified: Secondary | ICD-10-CM | POA: Insufficient documentation

## 2022-01-14 DIAGNOSIS — M545 Low back pain, unspecified: Secondary | ICD-10-CM | POA: Insufficient documentation

## 2022-01-14 MED ORDER — LIDOCAINE 5 % EX PTCH: 1.0000 | MEDICATED_PATCH | CUTANEOUS | 0 refills | Status: AC

## 2022-01-14 MED ORDER — CYCLOBENZAPRINE HCL 10 MG PO TABS: 10.0000 mg | ORAL_TABLET | Freq: Two times a day (BID) | ORAL | 0 refills | Status: AC | PRN
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
## 2024-12-05 ENCOUNTER — Emergency Department (HOSPITAL_BASED_OUTPATIENT_CLINIC_OR_DEPARTMENT_OTHER)

## 2024-12-05 ENCOUNTER — Other Ambulatory Visit: Payer: Self-pay

## 2024-12-05 ENCOUNTER — Encounter (HOSPITAL_BASED_OUTPATIENT_CLINIC_OR_DEPARTMENT_OTHER): Payer: Self-pay

## 2024-12-05 ENCOUNTER — Emergency Department (HOSPITAL_BASED_OUTPATIENT_CLINIC_OR_DEPARTMENT_OTHER)
Admission: EM | Admit: 2024-12-05 | Discharge: 2024-12-05 | Disposition: A | Discharge status: Home / Self Care | Attending: Emergency Medicine | Admitting: Emergency Medicine

## 2024-12-05 DIAGNOSIS — S83004A Unspecified dislocation of right patella, initial encounter: Secondary | ICD-10-CM | POA: Diagnosis not present

## 2024-12-05 DIAGNOSIS — X501XXA Overexertion from prolonged static or awkward postures, initial encounter: Secondary | ICD-10-CM | POA: Insufficient documentation

## 2024-12-05 DIAGNOSIS — S8991XA Unspecified injury of right lower leg, initial encounter: Secondary | ICD-10-CM | POA: Diagnosis present

## 2024-12-05 MED ORDER — IBUPROFEN 600 MG PO TABS: 600.0000 mg | ORAL_TABLET | Freq: Four times a day (QID) | ORAL | 0 refills | Status: AC | PRN
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
## 2024-12-05 NOTE — Discharge Instructions (Signed)
Follow up with the Orthopaedist for evaluation  
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
## 2025-08-17 ENCOUNTER — Ambulatory Visit: Admitting: Physician Assistant
